# Patient Record
Sex: Male | Born: 1970 | Race: White | Hispanic: No | Marital: Single
Health system: Southern US, Community
[De-identification: ages and names within clinical notes are randomized; demographics above are authoritative.]

---

## 2020-01-26 ENCOUNTER — Emergency Department (HOSPITAL_COMMUNITY): Payer: Self-pay

## 2020-01-26 ENCOUNTER — Emergency Department (HOSPITAL_COMMUNITY): Payer: Self-pay | Admitting: Anesthesiology

## 2020-01-26 ENCOUNTER — Encounter (HOSPITAL_COMMUNITY)
Admission: EM | Disposition: A | Discharge status: Home / Self Care | Payer: Self-pay | Source: Home / Self Care | Attending: Orthopedic Surgery

## 2020-01-26 ENCOUNTER — Observation Stay (HOSPITAL_COMMUNITY)
Admission: EM | Admit: 2020-01-26 | Discharge: 2020-01-27 | Disposition: A | Discharge status: Home / Self Care | Payer: Self-pay | Attending: Orthopedic Surgery | Admitting: Orthopedic Surgery

## 2020-01-26 DIAGNOSIS — Y9389 Activity, other specified: Secondary | ICD-10-CM | POA: Insufficient documentation

## 2020-01-26 DIAGNOSIS — Z20822 Contact with and (suspected) exposure to covid-19: Secondary | ICD-10-CM | POA: Diagnosis not present

## 2020-01-26 DIAGNOSIS — S82842A Displaced bimalleolar fracture of left lower leg, initial encounter for closed fracture: Secondary | ICD-10-CM | POA: Diagnosis not present

## 2020-01-26 DIAGNOSIS — Z23 Encounter for immunization: Secondary | ICD-10-CM | POA: Insufficient documentation

## 2020-01-26 DIAGNOSIS — S82892A Other fracture of left lower leg, initial encounter for closed fracture: Secondary | ICD-10-CM

## 2020-01-26 DIAGNOSIS — S99912A Unspecified injury of left ankle, initial encounter: Secondary | ICD-10-CM | POA: Diagnosis present

## 2020-01-26 DIAGNOSIS — Z419 Encounter for procedure for purposes other than remedying health state, unspecified: Secondary | ICD-10-CM

## 2020-01-26 DIAGNOSIS — Z9889 Other specified postprocedural states: Secondary | ICD-10-CM

## 2020-01-26 HISTORY — PX: ORIF ANKLE FRACTURE: SHX5408

## 2020-01-26 LAB — RESPIRATORY PANEL BY RT PCR (FLU A&B, COVID)
Influenza A by PCR: NEGATIVE
Influenza B by PCR: NEGATIVE
SARS Coronavirus 2 by RT PCR: NEGATIVE

## 2020-01-26 SURGERY — OPEN REDUCTION INTERNAL FIXATION (ORIF) ANKLE FRACTURE
Anesthesia: Monitor Anesthesia Care | Site: Ankle | Laterality: Left

## 2020-01-26 MED ORDER — MIDAZOLAM HCL 2 MG/2ML IJ SOLN
INTRAMUSCULAR | Status: DC | PRN
  Administered 2020-01-26: 2 mg via INTRAVENOUS

## 2020-01-26 MED ORDER — PROPOFOL 10 MG/ML IV BOLUS
INTRAVENOUS | Status: AC
  Administered 2020-01-26: 160 mg via INTRAVENOUS
  Filled 2020-01-26: qty 20

## 2020-01-26 MED ORDER — METHOCARBAMOL 500 MG PO TABS
500.0000 mg | ORAL_TABLET | Freq: Four times a day (QID) | ORAL | Status: DC | PRN
  Administered 2020-01-27: 500 mg via ORAL
  Filled 2020-01-26: qty 1

## 2020-01-26 MED ORDER — FENTANYL CITRATE (PF) 250 MCG/5ML IJ SOLN
INTRAMUSCULAR | Status: AC
Start: 2020-01-26 — End: ?
  Filled 2020-01-26: qty 5

## 2020-01-26 MED ORDER — BUPIVACAINE-EPINEPHRINE (PF) 0.5% -1:200000 IJ SOLN
INTRAMUSCULAR | Status: DC | PRN
  Administered 2020-01-26: 30 mL via PERINEURAL

## 2020-01-26 MED ORDER — METOCLOPRAMIDE HCL 5 MG/ML IJ SOLN: 5.0000 mg | Freq: Three times a day (TID) | INTRAMUSCULAR | Status: DC | PRN

## 2020-01-26 MED ORDER — ACETAMINOPHEN 500 MG PO TABS
1000.0000 mg | ORAL_TABLET | Freq: Four times a day (QID) | ORAL | Status: DC
  Administered 2020-01-26 – 2020-01-27 (×3): 1000 mg via ORAL
  Filled 2020-01-26 (×3): qty 2

## 2020-01-26 MED ORDER — SODIUM CHLORIDE 0.9 % IV BOLUS
1000.0000 mL | Freq: Once | INTRAVENOUS | Status: AC
  Administered 2020-01-26: 1000 mL via INTRAVENOUS

## 2020-01-26 MED ORDER — RIVAROXABAN 10 MG PO TABS
10.0000 mg | ORAL_TABLET | Freq: Every day | ORAL | Status: DC
  Filled 2020-01-26: qty 1

## 2020-01-26 MED ORDER — BUPIVACAINE HCL (PF) 0.25 % IJ SOLN
INTRAMUSCULAR | Status: AC
  Filled 2020-01-26: qty 30

## 2020-01-26 MED ORDER — PROPOFOL 10 MG/ML IV BOLUS
INTRAVENOUS | Status: AC
  Filled 2020-01-26: qty 40

## 2020-01-26 MED ORDER — 0.9 % SODIUM CHLORIDE (POUR BTL) OPTIME
TOPICAL | Status: DC | PRN
  Administered 2020-01-26: 1000 mL

## 2020-01-26 MED ORDER — HYDROMORPHONE HCL 1 MG/ML IJ SOLN
0.5000 mg | INTRAMUSCULAR | Status: DC | PRN
  Administered 2020-01-27 (×2): 0.5 mg via INTRAVENOUS
  Filled 2020-01-26 (×2): qty 1

## 2020-01-26 MED ORDER — LACTATED RINGERS IV SOLN: INTRAVENOUS | Status: DC | PRN

## 2020-01-26 MED ORDER — HYDROMORPHONE HCL 1 MG/ML IJ SOLN: 0.2500 mg | INTRAMUSCULAR | Status: DC | PRN

## 2020-01-26 MED ORDER — HYDROMORPHONE HCL 1 MG/ML IJ SOLN
1.0000 mg | Freq: Once | INTRAMUSCULAR | Status: AC
  Administered 2020-01-26: 1 mg via INTRAVENOUS
  Filled 2020-01-26: qty 1

## 2020-01-26 MED ORDER — ONDANSETRON HCL 4 MG PO TABS: 4.0000 mg | ORAL_TABLET | Freq: Four times a day (QID) | ORAL | Status: DC | PRN

## 2020-01-26 MED ORDER — METHOCARBAMOL 1000 MG/10ML IJ SOLN: 500.0000 mg | Freq: Four times a day (QID) | INTRAVENOUS | Status: DC | PRN

## 2020-01-26 MED ORDER — ONDANSETRON HCL 4 MG/2ML IJ SOLN: 4.0000 mg | Freq: Once | INTRAMUSCULAR | Status: DC | PRN

## 2020-01-26 MED ORDER — PROPOFOL 10 MG/ML IV BOLUS
INTRAVENOUS | Status: AC
  Filled 2020-01-26: qty 20

## 2020-01-26 MED ORDER — MIDAZOLAM HCL 2 MG/2ML IJ SOLN
INTRAMUSCULAR | Status: AC
  Filled 2020-01-26: qty 2

## 2020-01-26 MED ORDER — ONDANSETRON HCL 4 MG/2ML IJ SOLN: 4.0000 mg | Freq: Four times a day (QID) | INTRAMUSCULAR | Status: DC | PRN

## 2020-01-26 MED ORDER — OXYCODONE HCL 5 MG PO TABS
5.0000 mg | ORAL_TABLET | ORAL | Status: DC | PRN
  Administered 2020-01-27 (×4): 10 mg via ORAL
  Filled 2020-01-26 (×5): qty 2

## 2020-01-26 MED ORDER — CEFAZOLIN SODIUM-DEXTROSE 2-4 GM/100ML-% IV SOLN
2.0000 g | Freq: Three times a day (TID) | INTRAVENOUS | Status: DC
  Administered 2020-01-27 (×2): 2 g via INTRAVENOUS
  Filled 2020-01-26 (×2): qty 100

## 2020-01-26 MED ORDER — CEFAZOLIN SODIUM-DEXTROSE 2-4 GM/100ML-% IV SOLN: 2.0000 g | INTRAVENOUS | Status: DC

## 2020-01-26 MED ORDER — FENTANYL CITRATE (PF) 250 MCG/5ML IJ SOLN
INTRAMUSCULAR | Status: DC | PRN
  Administered 2020-01-26: 100 ug via INTRAVENOUS

## 2020-01-26 MED ORDER — DOCUSATE SODIUM 100 MG PO CAPS
100.0000 mg | ORAL_CAPSULE | Freq: Two times a day (BID) | ORAL | Status: DC
  Administered 2020-01-27: 100 mg via ORAL
  Filled 2020-01-26: qty 1

## 2020-01-26 MED ORDER — STERILE WATER FOR IRRIGATION IR SOLN
Status: DC | PRN
  Administered 2020-01-26: 1000 mL

## 2020-01-26 MED ORDER — PROPOFOL 500 MG/50ML IV EMUL
INTRAVENOUS | Status: DC | PRN
  Administered 2020-01-26: 125 ug/kg/min via INTRAVENOUS

## 2020-01-26 MED ORDER — MEPERIDINE HCL 25 MG/ML IJ SOLN: 6.2500 mg | INTRAMUSCULAR | Status: DC | PRN

## 2020-01-26 MED ORDER — LIDOCAINE-EPINEPHRINE (PF) 1.5 %-1:200000 IJ SOLN
INTRAMUSCULAR | Status: DC | PRN
  Administered 2020-01-26: 30 mL via PERINEURAL

## 2020-01-26 MED ORDER — PHENYLEPHRINE HCL-NACL 10-0.9 MG/250ML-% IV SOLN: INTRAVENOUS | Status: DC | PRN

## 2020-01-26 MED ORDER — TETANUS-DIPHTH-ACELL PERTUSSIS 5-2.5-18.5 LF-MCG/0.5 IM SUSY
0.5000 mL | PREFILLED_SYRINGE | Freq: Once | INTRAMUSCULAR | Status: AC
  Administered 2020-01-26: 0.5 mL via INTRAMUSCULAR
  Filled 2020-01-26: qty 0.5

## 2020-01-26 MED ORDER — VANCOMYCIN HCL 1000 MG IV SOLR
INTRAVENOUS | Status: DC | PRN
  Administered 2020-01-26: 1000 mg via TOPICAL

## 2020-01-26 MED ORDER — CEFAZOLIN SODIUM-DEXTROSE 2-3 GM-%(50ML) IV SOLR
INTRAVENOUS | Status: DC | PRN
  Administered 2020-01-26: 2 g via INTRAVENOUS

## 2020-01-26 MED ORDER — METOCLOPRAMIDE HCL 5 MG PO TABS: 5.0000 mg | ORAL_TABLET | Freq: Three times a day (TID) | ORAL | Status: DC | PRN

## 2020-01-26 MED ORDER — PHENYLEPHRINE HCL (PRESSORS) 10 MG/ML IV SOLN
INTRAVENOUS | Status: DC | PRN
  Administered 2020-01-26 (×4): 80 ug via INTRAVENOUS

## 2020-01-26 MED ORDER — PROPOFOL 10 MG/ML IV BOLUS
INTRAVENOUS | Status: DC | PRN
  Administered 2020-01-26: 50 mg via INTRAVENOUS

## 2020-01-26 MED ORDER — PROPOFOL 10 MG/ML IV BOLUS
1.0000 mg/kg | Freq: Once | INTRAVENOUS | Status: DC
  Filled 2020-01-26: qty 20

## 2020-01-26 MED ORDER — VANCOMYCIN HCL 1000 MG IV SOLR
INTRAVENOUS | Status: AC
  Filled 2020-01-26: qty 1000

## 2020-01-26 MED ORDER — LACTATED RINGERS IV SOLN: INTRAVENOUS | Status: AC

## 2020-01-26 MED ORDER — PHENYLEPHRINE 40 MCG/ML (10ML) SYRINGE FOR IV PUSH (FOR BLOOD PRESSURE SUPPORT)
PREFILLED_SYRINGE | INTRAVENOUS | Status: AC
  Filled 2020-01-26: qty 10

## 2020-01-26 MED ORDER — PHENYLEPHRINE HCL-NACL 10-0.9 MG/250ML-% IV SOLN
INTRAVENOUS | Status: DC | PRN
  Administered 2020-01-26: 25 ug/min via INTRAVENOUS

## 2020-01-26 SURGICAL SUPPLY — 81 items
ANKLE SYNDEMOSIS ZIPTIGHT (Ankle) ×2 IMPLANT
BIT DRILL 110X2.5XQCK CNCT (BIT) ×1 IMPLANT
BIT DRILL 2.5 (BIT) ×2
BIT DRILL 2.7XCANN QCK CNCT (BIT) ×1 IMPLANT
BIT DRILL CANN 2.7 (BIT) ×2
BIT DRILL QC 110 3.5 (BIT) ×1
BIT DRILL QC 110 3.5MM (BIT) ×1 IMPLANT
BIT DRILL STD 2.0MM (DRILL) ×1 IMPLANT
BIT DRL 110X2.5XQCK CNCT (BIT) ×1
BIT DRL 2.7XCANN QCK CNCT (BIT) ×1
BLADE SURG 10 STRL SS (BLADE) ×2 IMPLANT
BNDG CMPR 9X4 STRL LF SNTH (GAUZE/BANDAGES/DRESSINGS) ×1
BNDG CMPR MED 10X6 ELC LF (GAUZE/BANDAGES/DRESSINGS)
BNDG COHESIVE 6X5 TAN STRL LF (GAUZE/BANDAGES/DRESSINGS) IMPLANT
BNDG ELASTIC 4X5.8 VLCR STR LF (GAUZE/BANDAGES/DRESSINGS) ×2 IMPLANT
BNDG ELASTIC 6X10 VLCR STRL LF (GAUZE/BANDAGES/DRESSINGS) IMPLANT
BNDG ELASTIC 6X5.8 VLCR STR LF (GAUZE/BANDAGES/DRESSINGS) ×2 IMPLANT
BNDG ESMARK 4X9 LF (GAUZE/BANDAGES/DRESSINGS) ×2 IMPLANT
BNDG GAUZE ELAST 4 BULKY (GAUZE/BANDAGES/DRESSINGS) IMPLANT
COVER SURGICAL LIGHT HANDLE (MISCELLANEOUS) ×2 IMPLANT
DRAPE C-ARM 42X72 X-RAY (DRAPES) ×2 IMPLANT
DRAPE SURG 17X23 STRL (DRAPES) ×2 IMPLANT
DRAPE U-SHAPE 47X51 STRL (DRAPES) ×2 IMPLANT
DRILL BIT QC 110 3.5MM (BIT) ×2
DRILL STANDARD 2.0MM (DRILL) ×2
DRSG AQUACEL AG ADV 3.5X 6 (GAUZE/BANDAGES/DRESSINGS) ×2 IMPLANT
DRSG AQUACEL AG ADV 3.5X10 (GAUZE/BANDAGES/DRESSINGS) ×2 IMPLANT
DRSG PAD ABDOMINAL 8X10 ST (GAUZE/BANDAGES/DRESSINGS) ×10 IMPLANT
ELECT CAUTERY BLADE 6.4 (BLADE) ×2 IMPLANT
ELECT REM PT RETURN 9FT ADLT (ELECTROSURGICAL) ×2
ELECTRODE REM PT RTRN 9FT ADLT (ELECTROSURGICAL) ×1 IMPLANT
GAUZE SPONGE 4X4 12PLY STRL (GAUZE/BANDAGES/DRESSINGS) IMPLANT
GAUZE XEROFORM 5X9 LF (GAUZE/BANDAGES/DRESSINGS) IMPLANT
GLOVE BIOGEL PI IND STRL 7.0 (GLOVE) ×1 IMPLANT
GLOVE BIOGEL PI IND STRL 8 (GLOVE) ×2 IMPLANT
GLOVE BIOGEL PI INDICATOR 7.0 (GLOVE) ×1
GLOVE BIOGEL PI INDICATOR 8 (GLOVE) ×2
GLOVE ECLIPSE 7.0 STRL STRAW (GLOVE) ×4 IMPLANT
GLOVE ECLIPSE 8.0 STRL XLNG CF (GLOVE) ×2 IMPLANT
GLOVE SURG SS PI 7.0 STRL IVOR (GLOVE) ×4 IMPLANT
GOWN STRL REUS W/ TWL LRG LVL3 (GOWN DISPOSABLE) ×3 IMPLANT
GOWN STRL REUS W/TWL LRG LVL3 (GOWN DISPOSABLE) ×6
K-WIRE ACE 1.6X6 (WIRE) ×4
KIT BASIN OR (CUSTOM PROCEDURE TRAY) ×2 IMPLANT
KIT TURNOVER KIT B (KITS) ×2 IMPLANT
KWIRE ACE 1.6X6 (WIRE) ×2 IMPLANT
MANIFOLD NEPTUNE II (INSTRUMENTS) ×2 IMPLANT
NEEDLE HYPO 25GX1X1/2 BEV (NEEDLE) ×2 IMPLANT
NS IRRIG 1000ML POUR BTL (IV SOLUTION) ×8 IMPLANT
PACK ORTHO EXTREMITY (CUSTOM PROCEDURE TRAY) ×2 IMPLANT
PAD ARMBOARD 7.5X6 YLW CONV (MISCELLANEOUS) ×4 IMPLANT
PAD CAST 4YDX4 CTTN HI CHSV (CAST SUPPLIES) ×2 IMPLANT
PADDING CAST COTTON 4X4 STRL (CAST SUPPLIES) ×4
PADDING CAST COTTON 6X4 STRL (CAST SUPPLIES) ×2 IMPLANT
PLATE LOCKING 8H FIBULA LEFT (Plate) ×2 IMPLANT
SCREW 3.5X12 (Screw) ×4 IMPLANT
SCREW BN 2.7X12X3.5XST NS (Screw) ×2 IMPLANT
SCREW CANN 1/3 THRD RVRS CT (Screw) ×2 IMPLANT
SCREW CANNULATED 4.0X40 (Screw) ×4 IMPLANT
SCREW LOCK 10X2.7XNS ELB (Screw) ×1 IMPLANT
SCREW LOCK 14X2.7X NS (Screw) ×1 IMPLANT
SCREW LOCKING 2.7X10MM (Screw) ×2 IMPLANT
SCREW LOCKING 2.7X12 (Screw) ×4 IMPLANT
SCREW LOCKING 2.7X14 (Screw) ×2 IMPLANT
SCREW PERI 3.5X14MM W/2.7 (Screw) ×8 IMPLANT
STOCKINETTE IMPERVIOUS 9X36 MD (GAUZE/BANDAGES/DRESSINGS) ×2 IMPLANT
SUCTION FRAZIER HANDLE 10FR (MISCELLANEOUS) ×1
SUCTION TUBE FRAZIER 10FR DISP (MISCELLANEOUS) ×1 IMPLANT
SUT ETHILON 3 0 PS 1 (SUTURE) ×10 IMPLANT
SUT VIC AB 2-0 CT1 27 (SUTURE) ×10
SUT VIC AB 2-0 CT1 TAPERPNT 27 (SUTURE) ×5 IMPLANT
SUT VIC AB 2-0 CTB1 (SUTURE) ×2 IMPLANT
SUT VIC AB 3-0 SH 27 (SUTURE) ×8
SUT VIC AB 3-0 SH 27X BRD (SUTURE) ×4 IMPLANT
SYR CONTROL 10ML LL (SYRINGE) ×2 IMPLANT
SYSTEM FIXATN ANKL SYNDESMOSIS (Ankle) ×1 IMPLANT
TOWEL GREEN STERILE (TOWEL DISPOSABLE) ×2 IMPLANT
TOWEL GREEN STERILE FF (TOWEL DISPOSABLE) ×2 IMPLANT
TUBE CONNECTING 12X1/4 (SUCTIONS) ×2 IMPLANT
WATER STERILE IRR 1000ML POUR (IV SOLUTION) ×2 IMPLANT
YANKAUER SUCT BULB TIP NO VENT (SUCTIONS) ×2 IMPLANT

## 2020-01-26 NOTE — Brief Op Note (Signed)
   01/26/2020  9:14 PM  PATIENT:  Zachary Newman  49 y.o. male  PRE-OPERATIVE DIAGNOSIS:  LEFT ANKLE FRACTURE  POST-OPERATIVE DIAGNOSIS:  LEFT ANKLE FRACTURE  PROCEDURE:  Procedure(s): OPEN REDUCTION INTERNAL FIXATION (ORIF) with bimalleolar ankle fixation of medial and lateral malleolar fracture and syndesmotic fixation with zip type from [employer]  SURGEON:  Surgeon(s): August Saucer, Corrie Mckusick, MD  ASSISTANT: Karenann Cai, PA  ANESTHESIA: Regional  EBL: 25 ml    Total I/O In: 700 [I.V.:700] Out: 50 [Blood:50]  BLOOD ADMINISTERED: none  DRAINS: none   LOCAL MEDICATIONS USED:  none  SPECIMEN:  No Specimen  COUNTS:  YES  TOURNIQUET:  * No tourniquets in log *  DICTATION: .Other Dictation: Dictation Number [id number]  PLAN OF CARE: Admit for overnight observation  PATIENT DISPOSITION:  PACU - hemodynamically stable

## 2020-01-26 NOTE — Anesthesia Postprocedure Evaluation (Signed)
Anesthesia Post Note  Patient: Zachary Newman  Procedure(s) Performed: OPEN REDUCTION INTERNAL FIXATION (ORIF) ANKLE FRACTURE (Left Ankle)     Patient location during evaluation: PACU Anesthesia Type: Regional Level of consciousness: awake and alert and patient cooperative Pain management: pain level controlled Vital Signs Assessment: post-procedure vital signs reviewed and stable Respiratory status: spontaneous breathing and respiratory function stable Cardiovascular status: stable Anesthetic complications: no   No complications documented.  Last Vitals:  Vitals:   01/26/20 2125 01/26/20 2130  BP:  123/66  Pulse: (!) 108 (!) 109  Resp: 15 16  Temp:    SpO2: 95% 96%    Last Pain:  Vitals:   01/26/20 2115  TempSrc:   PainSc: 0-No pain                 Louanne Calvillo DAVID

## 2020-01-26 NOTE — Anesthesia Procedure Notes (Signed)
Anesthesia Regional Block: Popliteal block   Pre-Anesthetic Checklist: ,, timeout performed, Correct Patient, Correct Site, Correct Laterality, Correct Procedure, Correct Position, site marked, Risks and benefits discussed,  Surgical consent,  Pre-op evaluation,  At surgeon's request and post-op pain management  Laterality: Left  Prep: chloraprep       Needles:  Injection technique: Single-shot  Needle Type: Echogenic Stimulator Needle     Needle Length: 9cm  Needle Gauge: 21     Additional Needles:   Procedures:, nerve stimulator,,,,,,,   Nerve Stimulator or Paresthesia:  Response: 0.4 mA,   Additional Responses:   Narrative:  Start time: 01/26/2020 6:15 PM End time: 01/26/2020 6:25 PM Injection made incrementally with aspirations every 5 mL.  Performed by: Personally  Anesthesiologist: Arta Bruce, MD  Additional Notes: Monitors applied. Patient sedated. Sterile prep and drape,hand hygiene and sterile gloves were used. Relevant anatomy identified.Needle position confirmed.Local anesthetic injected incrementally after negative aspiration. Local anesthetic spread visualized around nerve(s). Vascular puncture avoided. No complications. Image printed for medical record.The patient tolerated the procedure well.  Additional Saphenous nerve block performed. 15cc Local Anesthetic mixture placed under ultrasonic guidance along the medio-inferior border of the Sartorious muscle 6 inches above the knee.  No Problems encountered.  Arta Bruce MD
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
## 2020-01-26 NOTE — Anesthesia Preprocedure Evaluation (Signed)
Anesthesia Evaluation  Patient identified by MRN, date of birth, ID band Patient awake    Reviewed: Allergy & Precautions, NPO status , Patient's Chart, lab work & pertinent test results  Airway Mallampati: II  TM Distance: >3 FB Neck ROM: Full    Dental   Pulmonary    Pulmonary exam normal        Cardiovascular Normal cardiovascular exam     Neuro/Psych    GI/Hepatic   Endo/Other    Renal/GU      Musculoskeletal   Abdominal   Peds  Hematology   Anesthesia Other Findings   Reproductive/Obstetrics                             Anesthesia Physical Anesthesia Plan  ASA: I and emergent  Anesthesia Plan: General   Post-op Pain Management:  Regional for Post-op pain   Induction:   PONV Risk Score and Plan: 2 and Ondansetron and Midazolam  Airway Management Planned: Oral ETT  Additional Equipment:   Intra-op Plan:   Post-operative Plan: Extubation in OR  Informed Consent: I have reviewed the patients History and Physical, chart, labs and discussed the procedure including the risks, benefits and alternatives for the proposed anesthesia with the patient or authorized representative who has indicated his/her understanding and acceptance.       Plan Discussed with: CRNA and Surgeon  Anesthesia Plan Comments:         Anesthesia Quick Evaluation

## 2020-01-26 NOTE — Transfer of Care (Signed)
Immediate Anesthesia Transfer of Care Note  Patient: Zachary Newman  Procedure(s) Performed: OPEN REDUCTION INTERNAL FIXATION (ORIF) ANKLE FRACTURE (Left Ankle)  Patient Location: PACU  Anesthesia Type:MAC combined with regional for post-op pain  Level of Consciousness: awake, alert  and oriented  Airway & Oxygen Therapy: Patient Spontanous Breathing  Post-op Assessment: Report given to RN, Post -op Vital signs reviewed and stable and Patient moving all extremities  Post vital signs: Reviewed and stable  Last Vitals:  Vitals Value Taken Time  BP 117/69   Temp    Pulse 113 01/26/20 2114  Resp 20 01/26/20 2114  SpO2 94 % 01/26/20 2114  Vitals shown include unvalidated device data.  Last Pain:  Vitals:   01/26/20 1731  TempSrc:   PainSc: 0-No pain         Complications: No complications documented.

## 2020-01-26 NOTE — ED Notes (Signed)
No CT per Dr Gwenlyn Fudge

## 2020-01-26 NOTE — ED Notes (Signed)
Vitals on respirations during sedation were capnography instead of repirations repirations were WDL and re-submited
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
## 2020-01-26 NOTE — Anesthesia Procedure Notes (Signed)
Procedure Name: MAC Date/Time: 01/26/2020 6:42 PM Performed by: Rande Brunt, CRNA Pre-anesthesia Checklist: Patient identified, Emergency Drugs available, Suction available, Patient being monitored and Timeout performed Patient Re-evaluated:Patient Re-evaluated prior to induction Oxygen Delivery Method: Simple face mask Placement Confirmation: positive ETCO2 and breath sounds checked- equal and bilateral Dental Injury: Teeth and Oropharynx as per pre-operative assessment
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
## 2020-01-26 NOTE — Progress Notes (Signed)
Orthopedic Tech Progress Note Patient Details:  Zachary Newman [birth year] [id number]   Level 2 Trauma Ortho Devices Type of Ortho Device: Short leg splint, Stirrup splint Ortho Device/Splint Location: Left Lower Extremity Ortho Device/Splint Interventions: Ordered, Application   Post Interventions Patient Tolerated: Well Instructions Provided: Adjustment of device, Care of device, Poper ambulation with device   Yaritsa Savarino P Harle Stanford 01/26/2020, 6:17 PM

## 2020-01-27 ENCOUNTER — Encounter (HOSPITAL_COMMUNITY): Payer: Self-pay | Admitting: Orthopedic Surgery

## 2020-01-27 ENCOUNTER — Other Ambulatory Visit: Payer: Self-pay

## 2020-01-27 LAB — CBC
HCT: 34 % — ABNORMAL LOW (ref 39.0–52.0)
Hemoglobin: 11.2 g/dL — ABNORMAL LOW (ref 13.0–17.0)
MCH: 29.1 pg (ref 26.0–34.0)
MCHC: 32.9 g/dL (ref 30.0–36.0)
MCV: 88.3 fL (ref 80.0–100.0)
Platelets: 304 10*3/uL (ref 150–400)
RBC: 3.85 MIL/uL — ABNORMAL LOW (ref 4.22–5.81)
RDW: 15.5 % (ref 11.5–15.5)
WBC: 15.4 10*3/uL — ABNORMAL HIGH (ref 4.0–10.5)
nRBC: 0 % (ref 0.0–0.2)

## 2020-01-27 LAB — BASIC METABOLIC PANEL
Anion gap: 11 (ref 5–15)
BUN: 31 mg/dL — ABNORMAL HIGH (ref 6–20)
CO2: 21 mmol/L — ABNORMAL LOW (ref 22–32)
Calcium: 8.4 mg/dL — ABNORMAL LOW (ref 8.9–10.3)
Chloride: 106 mmol/L (ref 98–111)
Creatinine, Ser: 2.69 mg/dL — ABNORMAL HIGH (ref 0.61–1.24)
GFR, Estimated: 28 mL/min — ABNORMAL LOW (ref >60)
Glucose, Bld: 95 mg/dL (ref 70–99)
Potassium: 4.1 mmol/L (ref 3.5–5.1)
Sodium: 138 mmol/L (ref 135–145)

## 2020-01-27 MED ORDER — OXYCODONE HCL 5 MG PO TABS
5.0000 mg | ORAL_TABLET | ORAL | 0 refills | Status: DC | PRN
Start: 2020-01-27 — End: 2020-02-03

## 2020-01-27 MED ORDER — METHOCARBAMOL 500 MG PO TABS: 500.0000 mg | ORAL_TABLET | Freq: Three times a day (TID) | ORAL | 0 refills | Status: AC | PRN

## 2020-01-27 MED ORDER — GABAPENTIN 100 MG PO CAPS: 100.0000 mg | ORAL_CAPSULE | Freq: Three times a day (TID) | ORAL | 0 refills | Status: AC

## 2020-01-27 MED ORDER — RIVAROXABAN 10 MG PO TABS: 10.0000 mg | ORAL_TABLET | Freq: Every day | ORAL | 0 refills | Status: AC
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
## 2020-01-27 NOTE — Plan of Care (Signed)
  Problem: Education: Goal: Knowledge of General Education information will improve Description Including pain rating scale, medication(s)/side effects and non-pharmacologic comfort measures Outcome: Progressing   

## 2020-01-27 NOTE — TOC CAGE-AID Note (Signed)
Transition of Care [facility]) - CAGE-AID Screening   Patient Details  Name: Zachary Newman MRN: [id number] Date of Birth: [birth year]  [facility] CM/SW Contact:    Emeterio Reeve, [place] Phone Number: 01/27/2020, 12:54 PM   Clinical Narrative:  CSW met with pt at bedside. CSW introduced self and explained role at the hospital.  Pt denies alcohol use. Pt denies substance use. Pt did not need any resources at this time.    CAGE-AID Screening:    Have You Ever Felt You Ought to Cut Down on Your Drinking or Drug Use?: No Have People Annoyed You By Critizing Your Drinking Or Drug Use?: No Have You Felt Bad Or Guilty About Your Drinking Or Drug Use?: No Have You Ever Had a Drink or Used Drugs First Thing In The Morning to Steady Your Nerves or to Get Rid of a Hangover?: No CAGE-AID Score: 0  Substance Abuse Education Offered: Yes    Blima Ledger, [place] Social Worker [phone]

## 2020-01-27 NOTE — Care Management (Signed)
Ordered 3 in 1 with Sheila with [employer]  Syliva Mee RN 
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
## 2020-01-27 NOTE — Progress Notes (Signed)
  Subjective: Patient is a 49 year old male who presents s/p left ankle ORIF.  He is postop day 1.  Doing well overall with no complaints.  Pain is well controlled.  He states he is ready for discharge home.  Objective: Vital signs in last 24 hours: Temp:  [97.8 F (36.6 C)-98.9 F (37.2 C)] 98.3 F (36.8 C) (11/01 0948) Pulse Rate:  [86-115] 103 (11/01 0948) Resp:  [13-33] 14 (11/01 0948) BP: (112-176)/(59-114) 159/84 (11/01 0948) SpO2:  [90 %-98 %] 96 % (11/01 0948) Weight:  [104.3 kg] 104.3 kg (10/31 1449)  Intake/Output from previous day: 10/31 0701 - 11/01 0700 In: 2115.9 [P.O.:780; I.V.:1235.9; IV Piggyback:100] Out: 1350 [Urine:1300; Blood:50] Intake/Output this shift: Total I/O In: 240 [P.O.:240] Out: 900 [Urine:900]  Exam:  Postop splint in place. Able to actively flex and extend toes of the left foot Excellent cap refill.  Toes are warm and well-perfused  Labs: Recent Labs    01/27/20 0826  HGB 11.2*   Recent Labs    01/27/20 0826  WBC 15.4*  RBC 3.85*  HCT 34.0*  PLT 304   Recent Labs    01/27/20 0826  NA 138  K 4.1  CL 106  CO2 21*  BUN 31*  CREATININE 2.69*  GLUCOSE 95  CALCIUM 8.4*   No results for input(s): LABPT, INR in the last 72 hours.  Assessment/Plan: Patient is POD 1 s/p left ankle ORIF.  He is nonweightbearing.  Pain is well controlled.  Plan for patient to discharge home today.  Follow-up with Dr. August Saucer in the clinic in 7 days.   Quashaun Lazalde L Chalene Treu 01/27/2020, 10:44 AM

## 2020-01-29 NOTE — ED Provider Notes (Signed)
Care assumed from Dr. Criss Alvine at shift change.  Patient with fracture/dislocation of the left ankle.  Patient will undergo conscious sedation once the Ortho tech is available.  The [facility]otec has completed her duties in the operating room and is now present in the ER.  Conscious sedation was to be performed with propofol.  As this medication was being administered, Dr. August Saucer arrived in the ED and performed the reduction.  Patient was given propofol and tolerated the procedure well.  Splint applied and patient to be taken to the operating room for surgical repair.   Geoffery Lyons, MD 01/29/20 [phone]

## 2020-02-03 ENCOUNTER — Telehealth: Payer: Self-pay | Admitting: Orthopedic Surgery

## 2020-02-03 ENCOUNTER — Other Ambulatory Visit: Payer: Self-pay | Admitting: Surgical

## 2020-02-03 NOTE — Telephone Encounter (Signed)
S/w patient. Advised done. Rescheduled appointment.

## 2020-02-03 NOTE — Telephone Encounter (Signed)
Please advise. Thanks.  

## 2020-02-03 NOTE — Telephone Encounter (Signed)
Sent in refill. Also, needs sooner appointment (Wednesday or Thursday of this week)

## 2020-02-03 NOTE — Telephone Encounter (Signed)
Patient called. He would like oxycodone called in for his pain. His call back number is [phone]
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
## 2020-02-07 ENCOUNTER — Encounter: Payer: Self-pay | Admitting: Orthopedic Surgery

## 2020-02-07 ENCOUNTER — Ambulatory Visit (INDEPENDENT_AMBULATORY_CARE_PROVIDER_SITE_OTHER): Payer: Self-pay | Admitting: Orthopedic Surgery

## 2020-02-07 ENCOUNTER — Ambulatory Visit (INDEPENDENT_AMBULATORY_CARE_PROVIDER_SITE_OTHER): Payer: Self-pay

## 2020-02-07 ENCOUNTER — Other Ambulatory Visit: Payer: Self-pay

## 2020-02-07 ENCOUNTER — Telehealth: Payer: Self-pay

## 2020-02-07 ENCOUNTER — Ambulatory Visit (HOSPITAL_COMMUNITY)
Admission: RE | Admit: 2020-02-07 | Discharge: 2020-02-07 | Disposition: A | Discharge status: Home / Self Care | Payer: Self-pay | Source: Ambulatory Visit | Attending: Orthopedic Surgery | Admitting: Orthopedic Surgery

## 2020-02-07 DIAGNOSIS — Z9889 Other specified postprocedural states: Secondary | ICD-10-CM | POA: Insufficient documentation

## 2020-02-07 NOTE — Telephone Encounter (Signed)
Fleet Contras with [facility] wanted to let Dr. August Saucer know that patient is Negative for DVT left LE.  Please advise.  Thank you.

## 2020-02-07 NOTE — Progress Notes (Signed)
   Post-Op Visit Note   Patient: Zachary Newman           Date of Birth: 1971-01-02           MRN: [id number] Visit Date: 02/07/2020 PCP: Patient, No Pcp Per   Assessment & Plan:  Chief Complaint:  Chief Complaint  Patient presents with  . Left Ankle - Routine Post Op   Visit Diagnoses:  1. Status post surgery     Plan: Patient is a 49 year old male presents s/p left ankle ORIF on 01/26/2020.  He complains of a significant amount of swelling in his left foot.  He has been taking pain medication primarily at night.  He is compliant with his nonweightbearing status.  He does note that Xarelto was too expensive so he opted not to pick this up from the pharmacy and he has not been taking anything for DVT prophylaxis since procedure.  On exam incisions are healing well and sutures are intact.  The sutures removed and replaced with Steri-Strips.  No calf tenderness on exam.  Negative Homans' sign.  Syndesmosis intact.  Patient is a Pharmacist, community and takes testosterone.  This combined with his history of recent procedure and nonweightbearing status causes concern for potential DVT despite the absence of calf tenderness today or any significant swelling.  Ordered ultrasound of the left lower extremity for further evaluation.  Discontinue the postop splint and boot provided.  Continue nonweightbearing status.  Recommended he work on improving his ankle range of motion with ankle pumps.  Radiographs were reviewed with the patient and show hardware in good position with no significant shifting since the procedure.  Follow-up in 2 weeks for clinical recheck.  Follow-Up Instructions: No follow-ups on file.   Orders:  Orders Placed This Encounter  Procedures  . XR Ankle Complete Left   No orders of the defined types were placed in this encounter.   Imaging: No results found.  PMFS History: Patient Active Problem List   Diagnosis Date Noted  . Status post surgery 01/26/2020  . Ankle  fracture, left 01/26/2020   No past medical history on file.  No family history on file.   Social History   Occupational History  . Not on file  Tobacco Use  . Smoking status: Not on file  Substance and Sexual Activity  . Alcohol use: Not on file  . Drug use: Not on file  . Sexual activity: Not on file

## 2020-02-07 NOTE — Telephone Encounter (Signed)
See below

## 2020-02-07 NOTE — Progress Notes (Signed)
Lower extremity venous LT study completed.  Preliminary results relayed to April for Dean, MD.   See CV Proc for preliminary results report.   Delshon Blanchfield, RDMS  

## 2020-02-07 NOTE — Telephone Encounter (Signed)
Great, all good, let him go, make sure he gets xarelto

## 2020-02-08 ENCOUNTER — Encounter: Payer: Self-pay | Admitting: Orthopedic Surgery

## 2020-02-10 ENCOUNTER — Other Ambulatory Visit: Payer: Self-pay | Admitting: Surgical

## 2020-02-10 ENCOUNTER — Telehealth: Payer: Self-pay

## 2020-02-10 NOTE — Telephone Encounter (Signed)
Patient called he is requesting rx refill for oxycodone. Call back:[phone]

## 2020-02-10 NOTE — Telephone Encounter (Signed)
IC spoke with patient he said he did not get blood thinners and he will not get them because he is unable to afford them. He is asking if something different can be sent for him? He said is unable to take aspirin due to NSAID allergy

## 2020-02-10 NOTE — Telephone Encounter (Signed)
We received rxrf request from pharmacy. Waiting to be advised by [facility].

## 2020-02-10 NOTE — Telephone Encounter (Signed)
Pls advise.  

## 2020-02-11 NOTE — Telephone Encounter (Signed)
Unfortunately there are no other cheap alternatives to the medication with him not having insurance.  He can just continue with ankle pumps and try and use that to help with preventing DVTs but that's obviously not as effective as pharmacologic prophylaxis.  He can either pay for medication or just continue with ankle pumps and accept the higher risk of DVT for which we will have to monitor him closely.

## 2020-02-12 ENCOUNTER — Inpatient Hospital Stay: Payer: Self-pay | Admitting: Physician Assistant

## 2020-02-12 ENCOUNTER — Inpatient Hospital Stay: Payer: Self-pay | Admitting: Orthopedic Surgery

## 2020-02-12 NOTE — Telephone Encounter (Signed)
I called pt and advised of message below. Will call with questions or concerns.

## 2020-02-24 ENCOUNTER — Ambulatory Visit (INDEPENDENT_AMBULATORY_CARE_PROVIDER_SITE_OTHER): Payer: Self-pay

## 2020-02-24 ENCOUNTER — Ambulatory Visit (INDEPENDENT_AMBULATORY_CARE_PROVIDER_SITE_OTHER): Payer: Self-pay | Admitting: Orthopedic Surgery

## 2020-02-24 DIAGNOSIS — Z9889 Other specified postprocedural states: Secondary | ICD-10-CM

## 2020-03-01 ENCOUNTER — Encounter: Payer: Self-pay | Admitting: Orthopedic Surgery

## 2020-03-01 NOTE — Progress Notes (Signed)
   Post-Op Visit Note   Patient: Zachary Newman           Date of Birth: [birth year]           MRN: [id number] Visit Date: 02/24/2020 PCP: Patient, No Pcp Per   Assessment & Plan:  Chief Complaint:  Chief Complaint  Patient presents with  . Left Ankle - Routine Post Op   Visit Diagnoses:  1. Status post surgery     Plan: Patient is a 49 year old male who presents s/p ORIF left ankle fracture with syndesmotic fixation on 01/26/2020.  He is ambulating with crutches and a fracture boot.  Not putting any weight on the leg.  Radiographs taken today show left ankle hardware in good position without any significant change at the prior radiographs.  Syndesmosis stable on exam.  Patient dorsiflexes to about 5 degrees past neutral.  Incisions are healing well without any significant erythema.  Overall patient is progressing well.  No calf tenderness, negative Homans' sign.  He is unable to afford blood thinners such as Xarelto or Eliquis and he is unable to take aspirin due to aspirin allergy.  No sign of DVT at this time.  Plan to begin weightbearing on December 15.  Follow-up in 3 weeks with x-ray check.  Additionally, concern was raised over patient's low GFR and high creatinine relative to his age on his recent lab work.  Counseled him to avoid anti-inflammatory medications.  Highly recommended that patient follow-up with his primary care physician or have referral to nephrology but patient states that he has had nephrology work-up in the past years ago with a kidney biopsy and he has been doing another evaluation at this time.  Follow-Up Instructions: No follow-ups on file.   Orders:  Orders Placed This Encounter  Procedures  . XR Ankle Complete Left   No orders of the defined types were placed in this encounter.   Imaging: No results found.  PMFS History: Patient Active Problem List   Diagnosis Date Noted  . Status post surgery 01/26/2020  . Ankle fracture, left 01/26/2020    No past medical history on file.  No family history on file.  Past Surgical History:  Procedure Laterality Date  . ORIF ANKLE FRACTURE Left 01/26/2020   Procedure: OPEN REDUCTION INTERNAL FIXATION (ORIF) ANKLE FRACTURE;  Surgeon: Cammy Copa, MD;  Location: [facility] OR;  Service: Orthopedics;  Laterality: Left;   Social History   Occupational History  . Not on file  Tobacco Use  . Smoking status: Not on file  Substance and Sexual Activity  . Alcohol use: Not on file  . Drug use: Not on file  . Sexual activity: Not on file

## 2020-03-16 ENCOUNTER — Ambulatory Visit (INDEPENDENT_AMBULATORY_CARE_PROVIDER_SITE_OTHER): Payer: Self-pay | Admitting: Orthopedic Surgery

## 2020-03-16 ENCOUNTER — Other Ambulatory Visit: Payer: Self-pay

## 2020-03-16 ENCOUNTER — Ambulatory Visit (INDEPENDENT_AMBULATORY_CARE_PROVIDER_SITE_OTHER): Payer: Self-pay

## 2020-03-16 DIAGNOSIS — Z9889 Other specified postprocedural states: Secondary | ICD-10-CM

## 2020-03-22 ENCOUNTER — Encounter: Payer: Self-pay | Admitting: Orthopedic Surgery

## 2020-03-22 NOTE — Progress Notes (Signed)
° °  Post-Op Visit Note   Patient: Zachary Newman           Date of Birth: 11-02-1970           MRN: [id number] Visit Date: 03/16/2020 PCP: Patient, No Pcp Per   Assessment & Plan:  Chief Complaint:  Chief Complaint  Patient presents with   Left Ankle - Routine Post Op   Visit Diagnoses:  1. Status post surgery     Plan: Zachary Newman is a 49 year old patient who is now 7 weeks out left ankle open reduction internal fixation with syndesmotic fixation.  He has been weightbearing in a fracture boot.  On exam he has both incisions well-healed.  Excellent stability ankle dorsiflexion plantarflexion as well as syndesmotic stress.  Swelling is diminishing.  Radiographs look good.  Plan is to discontinue the boot.  4-week return for clinical recheck final radiographs and release.  Follow-Up Instructions: Return in about 4 weeks (around 04/13/2020).   Orders:  Orders Placed This Encounter  Procedures   XR Ankle Complete Left   No orders of the defined types were placed in this encounter.   Imaging: No results found.  PMFS History: Patient Active Problem List   Diagnosis Date Noted   Status post surgery 01/26/2020   Ankle fracture, left 01/26/2020   History reviewed. No pertinent past medical history.  History reviewed. No pertinent family history.  Past Surgical History:  Procedure Laterality Date   ORIF ANKLE FRACTURE Left 01/26/2020   Procedure: OPEN REDUCTION INTERNAL FIXATION (ORIF) ANKLE FRACTURE;  Surgeon: Cammy Copa, MD;  Location: [facility] OR;  Service: Orthopedics;  Laterality: Left;   Social History   Occupational History   Not on file  Tobacco Use   Smoking status: Not on file   Smokeless tobacco: Not on file  Substance and Sexual Activity   Alcohol use: Not on file   Drug use: Not on file   Sexual activity: Not on file

## 2020-04-16 ENCOUNTER — Ambulatory Visit: Payer: Self-pay | Admitting: Orthopedic Surgery

## 2022-03-06 IMAGING — RF DG ANKLE 2V *L*
1 series · 3 of 3 positions shown · non-contrast
Comparison: 01/26/2020

CLINICAL DATA: Left ankle fracture, internal fixation

EXAM:
DG C-ARM 1-60 MIN; LEFT ANKLE - 2 VIEW

[Series 1: run · 3 of 3 slices shown]
[im 1/3]
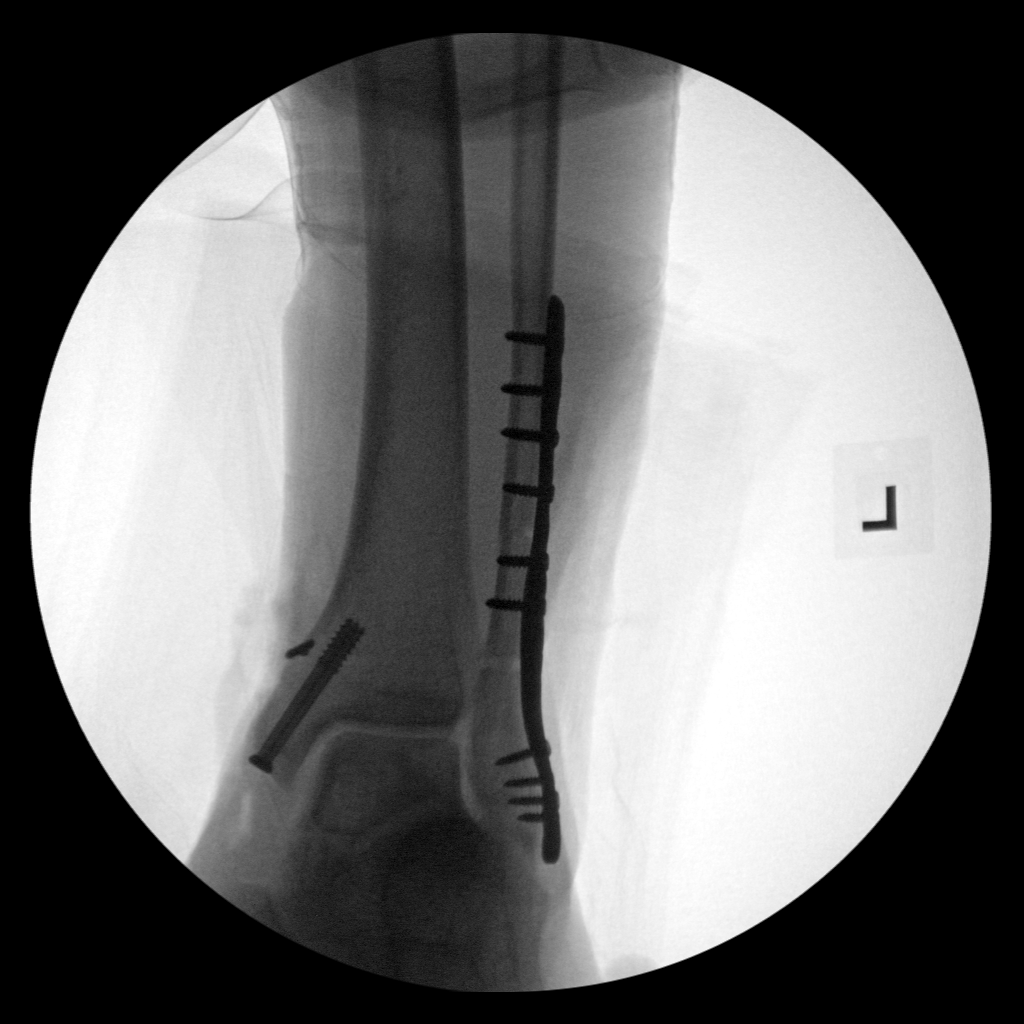
[im 2/3]
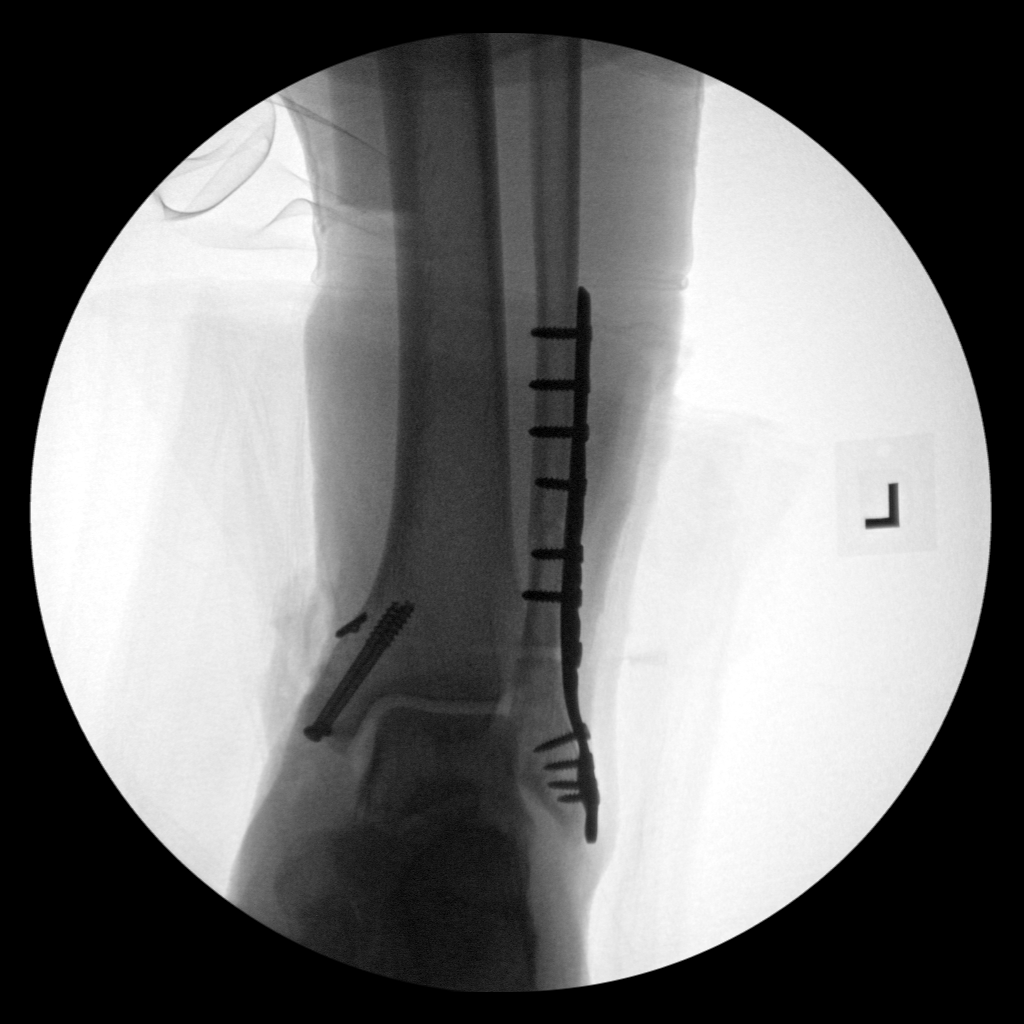
[im 3/3]
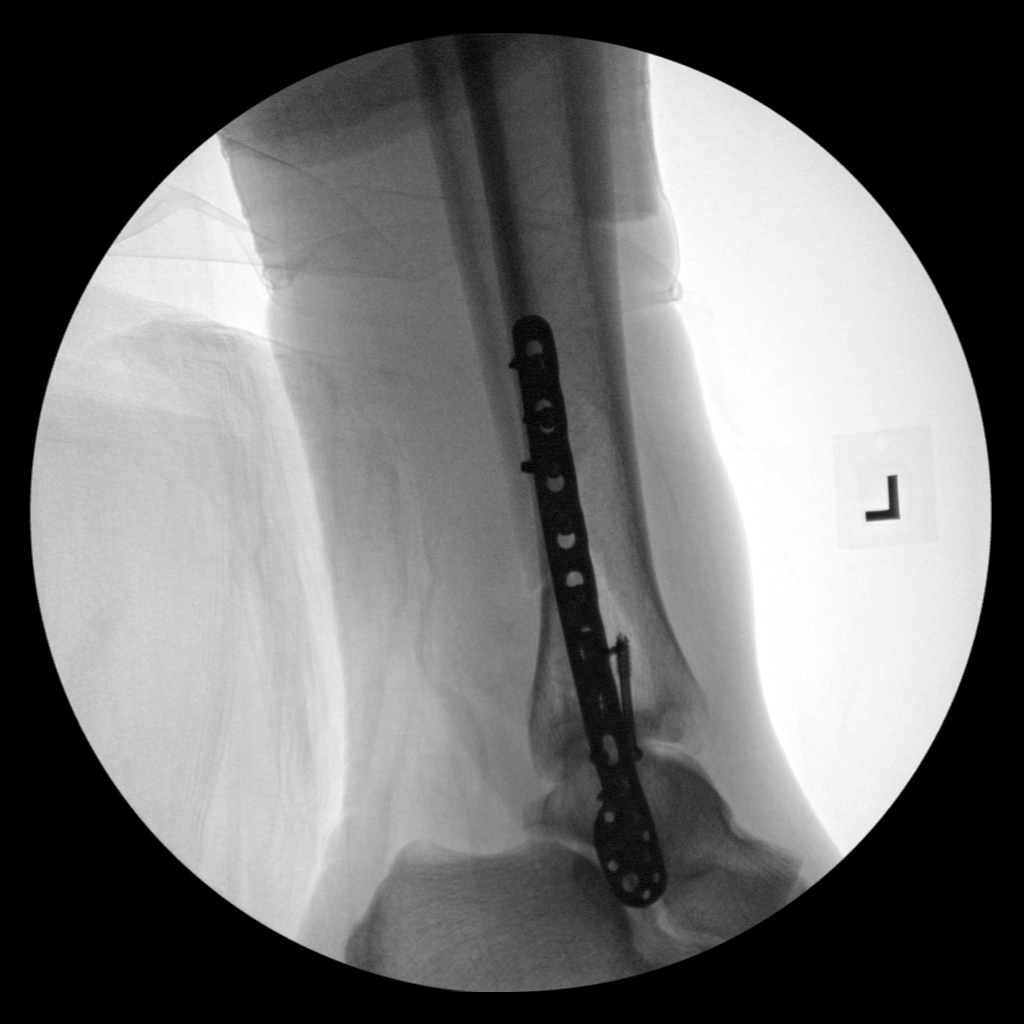

[3 of 3 positions shown; findings below may reference images not displayed]

FINDINGS: Plate and screw fixation across the distal fibular fracture. Screws
seen within the medial malleolus across the medial malleolar
fracture. Anatomic alignment. No hardware complicating feature.
IMPRESSION: Internal fixation. No visible complicating feature. Anatomic
alignment.

## 2022-03-06 IMAGING — DX DG ANKLE COMPLETE 3+V*L*
1 series · 2 of 2 positions shown · non-contrast
Comparison: Pre reduction radiographs earlier today.

CLINICAL DATA: Distal tibia and fibular fractures, postreduction.

EXAM:
LEFT ANKLE COMPLETE - 3+ VIEW

[Series 1: ankle · 0.14mm/px · 2 of 2 slices shown]
[im 1/2]
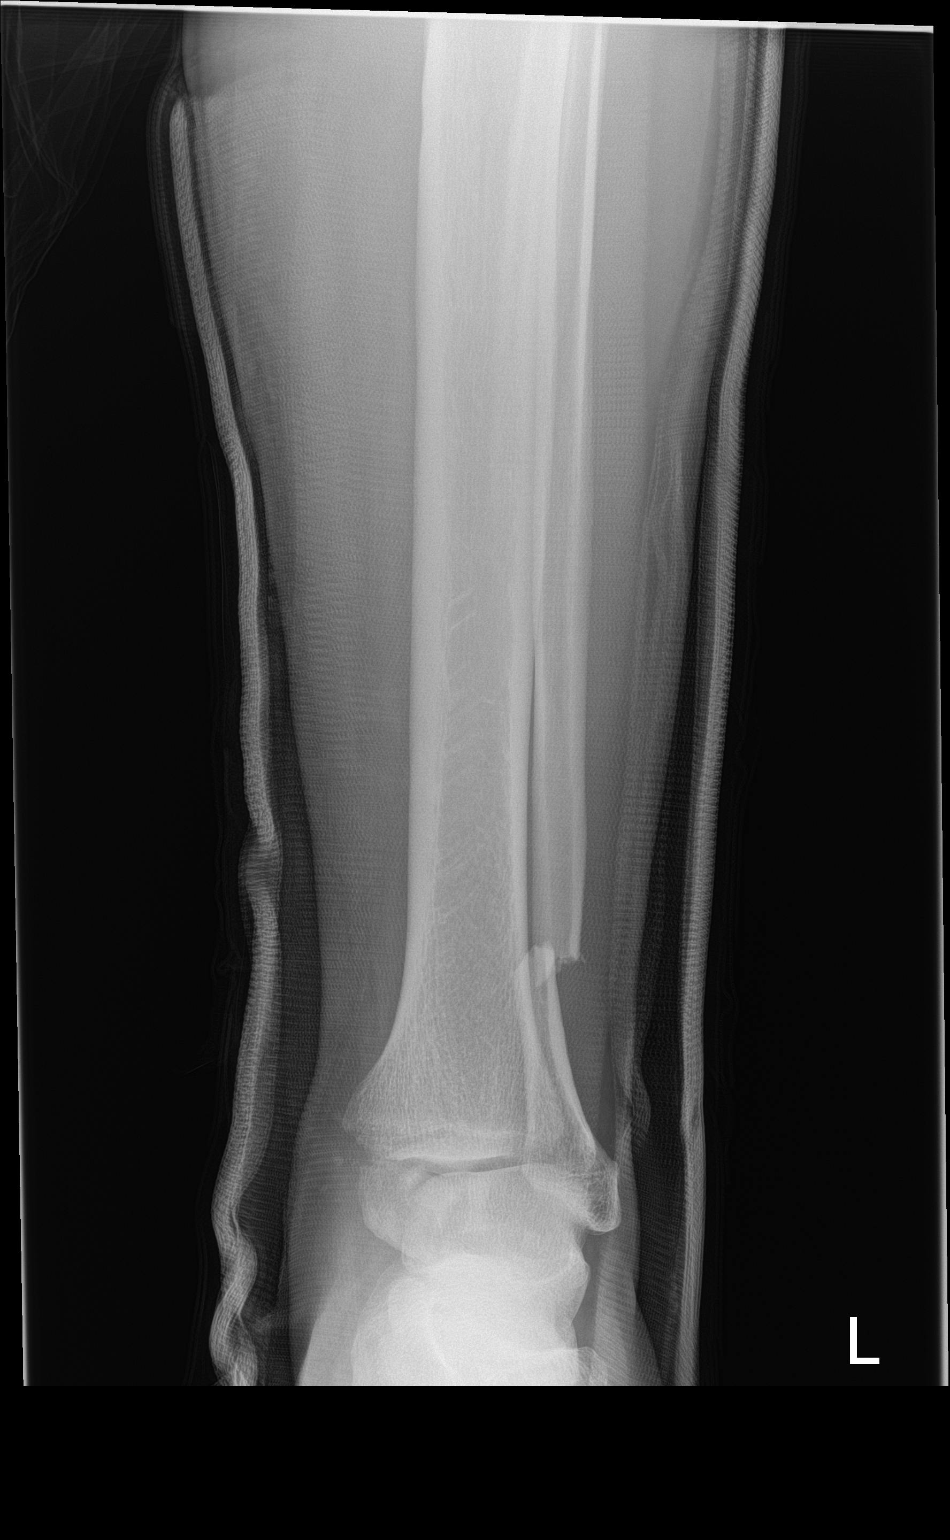
[im 2/2]
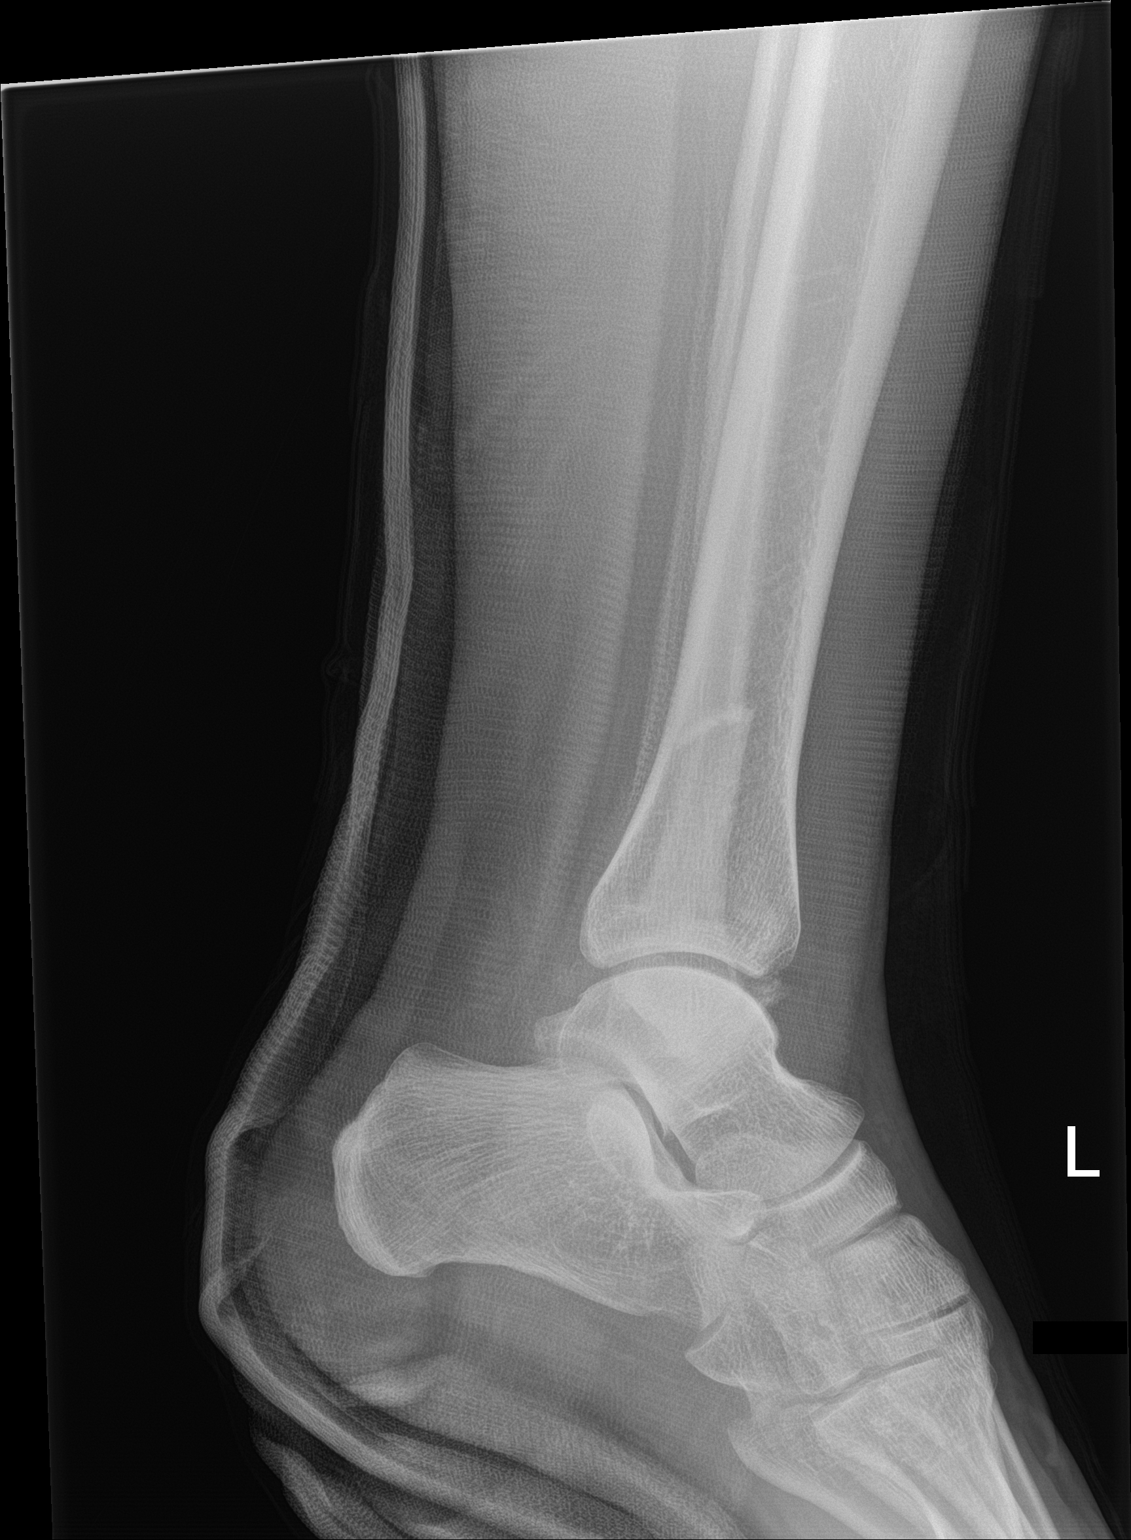

[2 of 2 positions shown; findings below may reference images not displayed]

FINDINGS: Improved alignment of the distal tibia and fibular fractures
postreduction. Transverse medial malleolar with decreased
displacement. Decreased lateral subluxation of the talus with
respect to the tibial plafond and. Decreased angulation of the
distal fibular fracture with mild residual displacement. No obvious
posterior tibial tubercle fracture is seen. Overlying splint
material limits osseous and soft tissue fine detail.
IMPRESSION: Improved alignment of the distal tibia and fibular fractures
postreduction, mild residual displacement. Improved ankle mortise
alignment with decreased lateral subluxation of the talus or respect
to the tibial plafond and.
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
## 2022-04-28 DEATH — deceased
